# Patient Record
Sex: Female | Born: 2000
Health system: Southern US, Community
[De-identification: ages and names within clinical notes are randomized; demographics above are authoritative.]

## PROBLEM LIST (undated history)

## (undated) DIAGNOSIS — Z789 Other specified health status: Secondary | ICD-10-CM

## (undated) HISTORY — PX: NO PAST SURGERIES: SHX2092

## (undated) HISTORY — DX: Other specified health status: Z78.9

---
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
## 2021-01-06 ENCOUNTER — Ambulatory Visit (LOCAL_COMMUNITY_HEALTH_CENTER): Payer: BC Managed Care – PPO

## 2021-01-06 ENCOUNTER — Other Ambulatory Visit: Payer: Self-pay

## 2021-01-06 DIAGNOSIS — Z111 Encounter for screening for respiratory tuberculosis: Secondary | ICD-10-CM

## 2021-01-09 ENCOUNTER — Other Ambulatory Visit: Payer: Self-pay

## 2021-01-09 ENCOUNTER — Ambulatory Visit (LOCAL_COMMUNITY_HEALTH_CENTER): Payer: BC Managed Care – PPO

## 2021-01-09 DIAGNOSIS — Z111 Encounter for screening for respiratory tuberculosis: Secondary | ICD-10-CM

## 2021-01-09 LAB — TB SKIN TEST
Induration: 0 mm
TB Skin Test: NEGATIVE

## 2021-03-02 ENCOUNTER — Encounter: Payer: Self-pay | Admitting: Obstetrics

## 2021-03-02 ENCOUNTER — Other Ambulatory Visit: Payer: Self-pay

## 2021-03-02 ENCOUNTER — Other Ambulatory Visit (HOSPITAL_COMMUNITY)
Admission: RE | Admit: 2021-03-02 | Discharge: 2021-03-02 | Disposition: A | Discharge status: Home / Self Care | Payer: BC Managed Care – PPO | Source: Ambulatory Visit | Attending: Obstetrics | Admitting: Obstetrics

## 2021-03-02 ENCOUNTER — Ambulatory Visit (INDEPENDENT_AMBULATORY_CARE_PROVIDER_SITE_OTHER): Payer: BC Managed Care – PPO | Admitting: Obstetrics

## 2021-03-02 VITALS — BP 100/70 | Ht 64.0 in | Wt 117.0 lb

## 2021-03-02 DIAGNOSIS — Z113 Encounter for screening for infections with a predominantly sexual mode of transmission: Secondary | ICD-10-CM | POA: Diagnosis present

## 2021-03-02 DIAGNOSIS — Z01419 Encounter for gynecological examination (general) (routine) without abnormal findings: Secondary | ICD-10-CM | POA: Diagnosis not present

## 2021-03-02 NOTE — Progress Notes (Signed)
Gynecology Annual Exam  PCP: [facility]  Chief Complaint:  Chief Complaint  Patient presents with   Gynecologic Exam    No concerns    History of Present Illness:  [name] is a 20 y.o. G0P0000 who LMP was Patient's last menstrual period was 02/15/2021 (exact date)., presents today for her annual examination.  Her menses are regular every 28-30 days, lasting 7 day(s).  Dysmenorrhea mild, occurring premenstrually. She does not have intermenstrual bleeding.  She is not sexually active and has been previously. Is trying to practice abstinence now..  Last Pap: NA Results were:  NA  /neg HPV DNA  Hx of STDs: chlamydia  There is no FH of breast cancer. There is no FH of ovarian cancer. The patient does do self-breast exams.  Tobacco use: The patient denies current or previous tobacco use. Alcohol use: none Exercise: moderately active  She admits to using MJ occasionally.  The patient wears seatbelts: yes.   The patient reports that domestic violence in her life is absent.   Past Medical History:  Diagnosis Date   No pertinent past medical history     Past Surgical History:  Procedure Laterality Date   NO PAST SURGERIES      Prior to Admission medications   Not on File    No Known Allergies  Gynecologic History: Patient's last menstrual period was 02/15/2021 (exact date). History of abnormal pap smear: No History of STI: Yes   Obstetric History: G0P0000  Social History   Socioeconomic History   Marital status: Single    Spouse name: Not on file   Number of children: Not on file   Years of education: Not on file   Highest education level: Not on file  Occupational History   Not on file  Tobacco Use   Smoking status: Never   Smokeless tobacco: Never  Vaping Use   Vaping Use: Never used  Substance and Sexual Activity   Alcohol use: Not Currently   Drug use: Not Currently   Sexual activity: Not Currently    Birth control/protection:  None  Other Topics Concern   Not on file  Social History Narrative   Not on file   Social Determinants of Health   Financial Resource Strain: Not on file  Food Insecurity: Not on file  Transportation Needs: Not on file  Physical Activity: Not on file  Stress: Not on file  Social Connections: Not on file  Intimate Partner Violence: Not on file    History reviewed. No pertinent family history.  ROS   Physical Exam BP 100/70   Ht 5\' 4"  (1.626 m)   Wt 117 lb (53.1 kg)   LMP 02/15/2021 (Exact Date)   BMI 20.08 kg/m    OBGyn Exam  Female chaperone present for pelvic and breast  portions of the physical exam  Results:     Assessment: 20 y.o. G0P0000 female here for routine annual gynecologic examination Desires STI screening. Plan: Problem List Items Addressed This Visit   None Visit Diagnoses     Screening for STD (sexually transmitted disease)    -  Primary   Relevant Orders   Cervicovaginal ancillary only   HEP, RPR, HIV Panel   Women's annual routine gynecological examination       Relevant Orders   HEP, RPR, HIV Panel     No lab availability today. She will RTC for another draw later this week.  Screening: -- Blood pressure screen normal -- Weight  screening: normal -- Depression screening negative (PHQ-9) -- Nutrition: normal -- cholesterol screening: not due for screening -- osteoporosis screening: not due -- tobacco screening: not using -- alcohol screening: AUDIT questionnaire indicates low-risk usage. -- family history of breast cancer screening: done. not at high risk. -- no evidence of domestic violence or intimate partner violence. -- STD screening: gonorrhea/chlamydia NAAT collected -- pap smear not collected per ASCCP guidelines -- flu vaccine  declines -- HPV vaccination series:  uncertain about whether she received this. Discussed.  Debra Williams, CNM  03/02/2021 3:11 PM   03/02/2021 3:10 PM

## 2021-03-04 LAB — CERVICOVAGINAL ANCILLARY ONLY
Bacterial Vaginitis (gardnerella): NEGATIVE
Chlamydia: NEGATIVE
Comment: NEGATIVE
Comment: NEGATIVE
Comment: NEGATIVE
Comment: NORMAL
Neisseria Gonorrhea: NEGATIVE
Trichomonas: NEGATIVE

## 2021-03-05 ENCOUNTER — Other Ambulatory Visit: Payer: Self-pay

## 2021-03-05 ENCOUNTER — Other Ambulatory Visit: Payer: BC Managed Care – PPO

## 2021-03-05 DIAGNOSIS — Z01419 Encounter for gynecological examination (general) (routine) without abnormal findings: Secondary | ICD-10-CM

## 2021-03-05 DIAGNOSIS — Z113 Encounter for screening for infections with a predominantly sexual mode of transmission: Secondary | ICD-10-CM

## 2021-03-06 ENCOUNTER — Encounter: Payer: Self-pay | Admitting: Obstetrics

## 2021-03-06 LAB — HEP, RPR, HIV PANEL
HIV Screen 4th Generation wRfx: NONREACTIVE
Hepatitis B Surface Ag: NEGATIVE
RPR Ser Ql: NONREACTIVE

## 2022-03-10 ENCOUNTER — Ambulatory Visit: Payer: BC Managed Care – PPO | Admitting: Obstetrics & Gynecology

## 2022-03-24 ENCOUNTER — Other Ambulatory Visit (HOSPITAL_COMMUNITY)
Admission: RE | Admit: 2022-03-24 | Discharge: 2022-03-24 | Disposition: A | Discharge status: Home / Self Care | Payer: BC Managed Care – PPO | Source: Ambulatory Visit | Attending: Anesthesiology | Admitting: Anesthesiology

## 2022-03-24 ENCOUNTER — Ambulatory Visit (INDEPENDENT_AMBULATORY_CARE_PROVIDER_SITE_OTHER): Payer: BC Managed Care – PPO | Admitting: Obstetrics & Gynecology

## 2022-03-24 ENCOUNTER — Encounter: Payer: Self-pay | Admitting: Obstetrics & Gynecology

## 2022-03-24 VITALS — BP 100/60 | HR 75 | Resp 15 | Ht 64.0 in | Wt 120.8 lb

## 2022-03-24 DIAGNOSIS — L700 Acne vulgaris: Secondary | ICD-10-CM

## 2022-03-24 DIAGNOSIS — Z113 Encounter for screening for infections with a predominantly sexual mode of transmission: Secondary | ICD-10-CM | POA: Diagnosis present

## 2022-03-24 DIAGNOSIS — Z01419 Encounter for gynecological examination (general) (routine) without abnormal findings: Secondary | ICD-10-CM

## 2022-03-24 MED ORDER — NORGESTIMATE-ETH ESTRADIOL 0.25-35 MG-MCG PO TABS: 1.0000 | ORAL_TABLET | Freq: Every day | ORAL | 11 refills | Status: DC

## 2022-03-24 NOTE — Addendum Note (Signed)
Addended by: Fonda Kinder on: 03/24/2022 11:21 AM   Modules accepted: Orders

## 2022-03-24 NOTE — Progress Notes (Signed)
Subjective:     Debra Williams is a 21 y.o. female here for a routine exam.  Current complaints: acne for the past 2 mos.  She is also here for her annual exam..  Personal health questionnaire reviewed: yes.   Gynecologic History Patient's last menstrual period was 03/06/2022 (exact date). Contraception: OCP (estrogen/progesterone)    Obstetric History OB History  Gravida Para Term Preterm AB Living  0 0 0 0 0 0  SAB IAB Ectopic Multiple Live Births  0 0 0 0 0     The following portions of the patient's history were reviewed and updated as appropriate: allergies, current medications, past family history, past medical history, past social history, past surgical history, and problem list.  Review of Systems A comprehensive review of systems was negative.    Objective:    Heart: regular rate and rhythm Abdomen: normal findings: no masses palpable and soft, non-tender Pelvic: cervix normal in appearance, external genitalia normal, no adnexal masses or tenderness, no cervical motion tenderness, uterus normal size, shape, and consistency, and vagina normal without discharge Extremities: extremities normal, atraumatic, no cyanosis or edema Skin: Skin color, texture, turgor normal. No rashes or lesions    Assessment:    Healthy female exam.   Acne Vulgaris Plan:    Contraception: OCP (estrogen/progesterone). Follow up in: 3 months.    Linzie Collin, MD  03/24/2022 11:09 AM

## 2022-03-25 LAB — CERVICOVAGINAL ANCILLARY ONLY
Bacterial Vaginitis (gardnerella): NEGATIVE
Candida Glabrata: NEGATIVE
Candida Vaginitis: NEGATIVE
Chlamydia: NEGATIVE
Comment: NEGATIVE
Comment: NEGATIVE
Comment: NEGATIVE
Comment: NEGATIVE
Comment: NEGATIVE
Comment: NORMAL
Neisseria Gonorrhea: NEGATIVE
Trichomonas: NEGATIVE

## 2022-03-25 LAB — HEP, RPR, HIV PANEL
HIV Screen 4th Generation wRfx: NONREACTIVE
Hepatitis B Surface Ag: NEGATIVE
RPR Ser Ql: NONREACTIVE

## 2022-06-24 ENCOUNTER — Ambulatory Visit: Payer: Self-pay | Admitting: Obstetrics and Gynecology

## 2022-10-14 ENCOUNTER — Encounter: Payer: Self-pay | Admitting: Obstetrics and Gynecology

## 2022-10-14 ENCOUNTER — Ambulatory Visit (INDEPENDENT_AMBULATORY_CARE_PROVIDER_SITE_OTHER): Payer: BC Managed Care – PPO | Admitting: Obstetrics and Gynecology

## 2022-10-14 VITALS — BP 118/79 | HR 93 | Ht 64.0 in | Wt 111.4 lb

## 2022-10-14 DIAGNOSIS — N764 Abscess of vulva: Secondary | ICD-10-CM | POA: Diagnosis not present

## 2022-10-14 DIAGNOSIS — N750 Cyst of Bartholin's gland: Secondary | ICD-10-CM

## 2022-10-14 NOTE — Progress Notes (Signed)
HPI:      [name] is a 22 y.o. G0P0000 who LMP was Patient's last menstrual period was 09/29/2022.  Subjective:   She presents today to discuss a left labial abscess.  She has had this occur twice in the past.  It most recently drained on March 2.  Since that time she is no longer having any pain from it but she can feel something deep in her labia that she has concerned about.    Hx: The following portions of the patient's history were reviewed and updated as appropriate:             She  has a past medical history of No pertinent past medical history. She does not have any pertinent problems on file. She  has a past surgical history that includes No past surgeries. Her family history is not on file. She  reports that she has never smoked. She has never used smokeless tobacco. She reports that she does not currently use alcohol. She reports that she does not currently use drugs. She has a current medication list which includes the following prescription(s): clindamycin-benzoyl per (refr), doxycycline, and tretinoin. She has No Known Allergies.       Review of Systems:  Review of Systems  Constitutional: Denied constitutional symptoms, night sweats, recent illness, fatigue, fever, insomnia and weight loss.  Eyes: Denied eye symptoms, eye pain, photophobia, vision change and visual disturbance.  Ears/Nose/Throat/Neck: Denied ear, nose, throat or neck symptoms, hearing loss, nasal discharge, sinus congestion and sore throat.  Cardiovascular: Denied cardiovascular symptoms, arrhythmia, chest pain/pressure, edema, exercise intolerance, orthopnea and palpitations.  Respiratory: Denied pulmonary symptoms, asthma, pleuritic pain, productive sputum, cough, dyspnea and wheezing.  Gastrointestinal: Denied, gastro-esophageal reflux, melena, nausea and vomiting.  Genitourinary: See HPI for additional information.  Musculoskeletal: Denied musculoskeletal symptoms, stiffness, swelling,  muscle weakness and myalgia.  Dermatologic: Denied dermatology symptoms, rash and scar.  Neurologic: Denied neurology symptoms, dizziness, headache, neck pain and syncope.  Psychiatric: Denied psychiatric symptoms, anxiety and depression.  Endocrine: Denied endocrine symptoms including hot flashes and night sweats.   Meds:   Current Outpatient Medications on File Prior to Visit  Medication Sig Dispense Refill   Clindamycin-Benzoyl Per, Refr, gel Apply topically every morning.     doxycycline (VIBRA-TABS) 100 MG tablet Take 100 mg by mouth 2 (two) times daily.     tretinoin (RETIN-A) 0.025 % cream Apply topically at bedtime.     No current facility-administered medications on file prior to visit.      Objective:     Vitals:   10/14/22 0901  BP: 118/79  Pulse: 93   Filed Weights   10/14/22 0901  Weight: 111 lb 6.4 oz (50.5 kg)              Physical examination   Pelvic:   Vulva: Normal appearance.  No lesions.  Vagina: No lesions or abnormalities noted.  Support: Normal pelvic support.  Urethra No masses tenderness or scarring.  Meatus Normal size without lesions or prolapse.  Cervix: Normal appearance.  No lesions.  Anus: Normal exam.  No lesions.  Perineum: Normal exam.  No lesions.    With palpation a small irregular area can be noted within the left labia toward the inner thigh.  Palpates like scar tissue or fibrous tissue.  It is nontender.        Assessment:    G0P0000 Patient Active Problem List   Diagnosis Date Noted   Chlamydia 07/09/2020  1. Left genital labial abscess     Her abscess is now resolved-deep within the labia and area of fibrous tissue/scar can be palpated   Plan:            1.  Expectant management.  Nothing further to do at this time.  2.  Should this occur again I have asked her to get on my schedule in a timely manner so that we can decide management without a long delay. Orders No orders of the defined types were placed in  this encounter.   No orders of the defined types were placed in this encounter.     F/U  Return for Annual Physical. I spent 22 minutes involved in the care of this patient preparing to see the patient by obtaining and reviewing her medical history (including labs, imaging tests and prior procedures), documenting clinical information in the electronic health record (EHR), counseling and coordinating care plans, writing and sending prescriptions, ordering tests or procedures and in direct communicating with the patient and medical staff discussing pertinent items from her history and physical exam.  Finis Bud, M.D. 10/14/2022 9:30 AM

## 2022-10-14 NOTE — Progress Notes (Signed)
Patient presents today due to labial abscess. She states in February noticing an abscess howver it drained itself on 3./2. she states concerns of something remaining in it. No additional questions. Will return for an annual at a later time.

## 2024-05-06 NOTE — Progress Notes (Unsigned)
 PCP:  [facility]   No chief complaint on file.    HPI:      [name] is a 23 y.o. G0P0000 whose LMP was No LMP recorded., presents today for her annual examination.  Her menses are {norm/abn:715}, lasting {number: 22536} days.  Dysmenorrhea {dysmen:716}. She {does:18564} have intermenstrual bleeding.  Sex activity: {sex active: 315163}. No pain/bleeding/dryness. Last Pap: {ijuz:695499699}  Results were: {norm/abn:16707::no abnormalities} /neg HPV DNA *** Hx of STDs: {STD hx:14358}  Last mammogram: {date:304500300}  Results were: {norm/abn:13465} There is no FH of breast cancer. There is no FH of ovarian cancer. The patient {does:18564} do self-breast exams.  Tobacco use: {tob:20664} Alcohol use: {Alcohol:11675} No drug use.  Exercise: {exercise:31265}  She {does:18564} get adequate calcium and Vitamin D in her diet.  Patient Active Problem List   Diagnosis Date Noted   Chlamydia 07/09/2020    Past Surgical History:  Procedure Laterality Date   NO PAST SURGERIES      No family history on file.  Social History   Socioeconomic History   Marital status: Single    Spouse name: Not on file   Number of children: Not on file   Years of education: Not on file   Highest education level: Not on file  Occupational History   Not on file  Tobacco Use   Smoking status: Never   Smokeless tobacco: Never  Vaping Use   Vaping status: Never Used  Substance and Sexual Activity   Alcohol use: Not Currently   Drug use: Not Currently   Sexual activity: Yes    Birth control/protection: None  Other Topics Concern   Not on file  Social History Narrative   Not on file   Social Drivers of Health   Financial Resource Strain: Low Risk  (12/14/2022)   Received from [facility]   Overall Financial Resource Strain (CARDIA)    Difficulty of Paying Living Expenses: Not very hard  Food Insecurity: No Food Insecurity (12/14/2022)    Received from [facility]   Hunger Vital Sign    Within the past 12 months, you worried that your food would run out before you got the money to buy more.: Never true    Within the past 12 months, the food you bought just didn't last and you didn't have money to get more.: Never true  Transportation Needs: No Transportation Needs (12/14/2022)   Received from [facility] - Transportation    In the past 12 months, has lack of transportation kept you from medical appointments or from getting medications?: No    Lack of Transportation (Non-Medical): No  Physical Activity: Not on file  Stress: Not on file  Social Connections: Not on file  Intimate Partner Violence: Not on file     Current Outpatient Medications:    Clindamycin-Benzoyl Per, Refr, gel, Apply topically every morning., Disp: , Rfl:    doxycycline (VIBRA-TABS) 100 MG tablet, Take 100 mg by mouth 2 (two) times daily., Disp: , Rfl:    tretinoin (RETIN-A) 0.025 % cream, Apply topically at bedtime., Disp: , Rfl:      ROS:  Review of Systems BREAST: No symptoms   Objective: There were no vitals taken for this visit.   OBGyn Exam  Results: No results found for this or any previous visit (from the past 24 hours).  Assessment/Plan: No diagnosis found.  No orders of the defined types were placed in this  encounter.            GYN counsel {counseling: 16159}     F/U  No follow-ups on file.  Tryphena Perkovich B. Donyae Kohn, PA-C 05/06/2024 6:37 PM

## 2024-05-07 ENCOUNTER — Encounter: Payer: Self-pay | Admitting: Obstetrics and Gynecology

## 2024-05-07 ENCOUNTER — Other Ambulatory Visit (HOSPITAL_COMMUNITY)
Admission: RE | Admit: 2024-05-07 | Discharge: 2024-05-07 | Disposition: A | Discharge status: Home / Self Care | Source: Ambulatory Visit | Attending: Obstetrics and Gynecology | Admitting: Obstetrics and Gynecology

## 2024-05-07 ENCOUNTER — Ambulatory Visit (INDEPENDENT_AMBULATORY_CARE_PROVIDER_SITE_OTHER): Admitting: Obstetrics and Gynecology

## 2024-05-07 VITALS — BP 115/78 | HR 88 | Ht 64.0 in | Wt 118.0 lb

## 2024-05-07 DIAGNOSIS — Z124 Encounter for screening for malignant neoplasm of cervix: Secondary | ICD-10-CM

## 2024-05-07 DIAGNOSIS — Z113 Encounter for screening for infections with a predominantly sexual mode of transmission: Secondary | ICD-10-CM | POA: Insufficient documentation

## 2024-05-07 DIAGNOSIS — Z01419 Encounter for gynecological examination (general) (routine) without abnormal findings: Secondary | ICD-10-CM | POA: Diagnosis not present

## 2024-05-07 NOTE — Patient Instructions (Signed)
 I value your feedback and you entrusting [place] with your care. If you get a [place] patient survey, I would appreciate you taking the time to let us know about your experience today. Thank you! ? ? ?

## 2024-05-09 LAB — CYTOLOGY - PAP
Chlamydia: NEGATIVE
Comment: NEGATIVE
Comment: NORMAL
Diagnosis: NEGATIVE
Neisseria Gonorrhea: NEGATIVE
